# Patient Record
Sex: Male | Born: 1965 | Race: White | Hispanic: No | Marital: Married | Smoking: Never smoker
Health system: Southern US, Community
[De-identification: ages and names within clinical notes are randomized; demographics above are authoritative.]

## PROBLEM LIST (undated history)

## (undated) HISTORY — PX: SHOULDER SURGERY: SHX246

## (undated) HISTORY — PX: LUMBAR SPINE SURGERY: SHX701

---

## 2014-08-06 ENCOUNTER — Encounter (HOSPITAL_COMMUNITY): Payer: Self-pay | Admitting: Orthopedic Surgery

## 2014-08-06 ENCOUNTER — Inpatient Hospital Stay: Admit: 2014-08-06 | Payer: Self-pay | Admitting: Orthopedic Surgery

## 2014-08-06 ENCOUNTER — Inpatient Hospital Stay (HOSPITAL_COMMUNITY)
Admission: AD | Admit: 2014-08-06 | Discharge: 2014-08-06 | DRG: 552 | Discharge status: Against Medical Advice | Payer: 59 | Source: Ambulatory Visit | Attending: Orthopedic Surgery | Admitting: Orthopedic Surgery

## 2014-08-06 ENCOUNTER — Ambulatory Visit (HOSPITAL_COMMUNITY): Payer: Self-pay | Admitting: Orthopedic Surgery

## 2014-08-06 DIAGNOSIS — IMO0002 Reserved for concepts with insufficient information to code with codable children: Secondary | ICD-10-CM | POA: Diagnosis present

## 2014-08-06 DIAGNOSIS — M549 Dorsalgia, unspecified: Secondary | ICD-10-CM | POA: Diagnosis present

## 2014-08-06 MED ORDER — DEXTROSE-NACL 5-0.45 % IV SOLN: INTRAVENOUS | Status: DC

## 2014-08-06 MED ORDER — HYDROCODONE-ACETAMINOPHEN 5-325 MG PO TABS: 1.0000 | ORAL_TABLET | ORAL | Status: DC | PRN

## 2014-08-06 MED ORDER — MORPHINE SULFATE 2 MG/ML IJ SOLN: 2.0000 mg | INTRAMUSCULAR | Status: DC | PRN

## 2014-08-06 NOTE — H&P (Signed)
The patient is a 48 year old male who presents with back and leg pain. The patient is here (08/01/14)in referral from Dr Ethelene Hal for surgical eval. The patient reports low back (left thigh) symptoms including pain, low back pain, numbness, burning and tingling which began 6 week(s) (July 25,th playing [place] state, and at dinner that night pain set in, tightness, ) ago. and Symptoms include pain, muscle spasm, numbness and pain in the calf (left), while symptoms do not include incontinence of stool or incontinence of urine. The pain radiates to the left lower leg. The patient describes the severity of their symptoms as 9 / 10. The patient feels as if the symptoms are worsening. Symptoms are exacerbated by sitting and bending. Symptoms are not relieved by nonsteroidal anti-inflammatory drugs or opioid analgesics. Past evaluation has included x-ray of the lumbar spine (chiropractor) and MRI of the lumbar spine (lumbar @ [facility]). Past treatment has included physical therapy and chiropractic manipulation.  He presents for evaluation of four weeks of severe back, buttock and now progressive left leg pain. He is referred by my partner, Dr. Ethelene Hal, as well as Dr. Silvestre Moment at [facility]  RADIOGRAPHS: His MRI from 08/01/14 shows moderate to marked degenerative disc disease at L4-5 and L3-4 but he has a small disc protrusion at 3-4 but there is no mass effect on the exiting L3 or traversing L4 nerve root. At L4-5 there is a disc fragment extruded into the posterolateral left corner causing compression of the traversing L5 nerve root. There is also significant stenosis secondary to that disc herniation. At L5-S1 there is some mild arthrosis of the facet but no significant central or lateral recess stenosis.   He did have plain x-rays done by his chiropractor but unfortunately I do not have those to review at this point in time.   At this point in time we have had a long discussion of the pathology and the  overall outcomes and the overall treatment options. After reviewing the nonoperative and operative treatment plans he would like to proceed with a microdiscectomy. We reviewed the risks which include infection, bleeding, nerve damage, death, stroke, paralysis, failure to heal, need for further surgery and recurrent disc herniation. All of his questions were encouraged and addressed. We will try and get this done as soon as possible. I have given him some Hydrocodone for some pain relief and to help assist with his sleeping. He will stop using the anti-inflammatories and as soon as we have clearance we will get this done. My hope is that at three months postop we will be able to reintroduce him back to his usual lifestyle just with some modifications to the frequency intensity of certain activities.   Patient contacted me several times since initial evaluation in the office on 08/01/14. As a result of his severe pain and loss in function I elected to admit patient for pain control and surgical management of the disc herniation. After arrival on the floor he stated that he wanted to have the injection and post-pone surgery.  As a result he left AMA.  I will go ahead and set up the injection with Dr Ethelene Hal. Patient was admitted and left before I could actual see him

## 2014-08-06 NOTE — Progress Notes (Signed)
Patient decided that he no longer wished to have surgery and decided to leave AMA. MD office notified and aware. Patient signed AMA paperwork and was discharged to home.

## 2014-08-07 SURGERY — MICRODISCECTOMY LUMBAR LAMINECTOMY
Anesthesia: General | Site: Back | Laterality: Left

## 2014-08-09 NOTE — Discharge Summary (Signed)
Patient signed out AMA shortly after arriving

## 2016-11-09 ENCOUNTER — Other Ambulatory Visit (HOSPITAL_BASED_OUTPATIENT_CLINIC_OR_DEPARTMENT_OTHER): Payer: Self-pay | Admitting: Family Medicine

## 2016-11-09 DIAGNOSIS — R1031 Right lower quadrant pain: Secondary | ICD-10-CM

## 2016-12-20 ENCOUNTER — Ambulatory Visit: Payer: Self-pay | Admitting: Sports Medicine

## 2016-12-21 DIAGNOSIS — R29898 Other symptoms and signs involving the musculoskeletal system: Secondary | ICD-10-CM | POA: Diagnosis not present

## 2016-12-23 DIAGNOSIS — Z86018 Personal history of other benign neoplasm: Secondary | ICD-10-CM | POA: Diagnosis not present

## 2016-12-23 DIAGNOSIS — D1801 Hemangioma of skin and subcutaneous tissue: Secondary | ICD-10-CM | POA: Diagnosis not present

## 2016-12-23 DIAGNOSIS — L814 Other melanin hyperpigmentation: Secondary | ICD-10-CM | POA: Diagnosis not present

## 2016-12-23 DIAGNOSIS — D225 Melanocytic nevi of trunk: Secondary | ICD-10-CM | POA: Diagnosis not present

## 2016-12-28 DIAGNOSIS — Z1211 Encounter for screening for malignant neoplasm of colon: Secondary | ICD-10-CM | POA: Diagnosis not present

## 2016-12-30 DIAGNOSIS — R29898 Other symptoms and signs involving the musculoskeletal system: Secondary | ICD-10-CM | POA: Diagnosis not present

## 2017-01-14 DIAGNOSIS — R29898 Other symptoms and signs involving the musculoskeletal system: Secondary | ICD-10-CM | POA: Diagnosis not present

## 2017-01-28 DIAGNOSIS — R29898 Other symptoms and signs involving the musculoskeletal system: Secondary | ICD-10-CM | POA: Diagnosis not present

## 2017-02-03 DIAGNOSIS — R29898 Other symptoms and signs involving the musculoskeletal system: Secondary | ICD-10-CM | POA: Diagnosis not present

## 2017-03-21 DIAGNOSIS — L03019 Cellulitis of unspecified finger: Secondary | ICD-10-CM | POA: Diagnosis not present

## 2017-05-05 DIAGNOSIS — Z0000 Encounter for general adult medical examination without abnormal findings: Secondary | ICD-10-CM | POA: Diagnosis not present

## 2017-05-05 DIAGNOSIS — Z136 Encounter for screening for cardiovascular disorders: Secondary | ICD-10-CM | POA: Diagnosis not present

## 2017-07-08 DIAGNOSIS — Z125 Encounter for screening for malignant neoplasm of prostate: Secondary | ICD-10-CM | POA: Diagnosis not present

## 2018-02-22 DIAGNOSIS — M5431 Sciatica, right side: Secondary | ICD-10-CM | POA: Diagnosis not present

## 2018-03-07 DIAGNOSIS — Z6825 Body mass index (BMI) 25.0-25.9, adult: Secondary | ICD-10-CM | POA: Diagnosis not present

## 2018-03-07 DIAGNOSIS — M5416 Radiculopathy, lumbar region: Secondary | ICD-10-CM | POA: Diagnosis not present

## 2018-03-07 DIAGNOSIS — R030 Elevated blood-pressure reading, without diagnosis of hypertension: Secondary | ICD-10-CM | POA: Diagnosis not present

## 2018-03-21 DIAGNOSIS — M5116 Intervertebral disc disorders with radiculopathy, lumbar region: Secondary | ICD-10-CM | POA: Diagnosis not present

## 2018-03-21 DIAGNOSIS — M48061 Spinal stenosis, lumbar region without neurogenic claudication: Secondary | ICD-10-CM | POA: Diagnosis not present

## 2018-03-21 DIAGNOSIS — M5416 Radiculopathy, lumbar region: Secondary | ICD-10-CM | POA: Diagnosis not present

## 2018-03-27 DIAGNOSIS — M5416 Radiculopathy, lumbar region: Secondary | ICD-10-CM | POA: Diagnosis not present

## 2018-03-27 DIAGNOSIS — R030 Elevated blood-pressure reading, without diagnosis of hypertension: Secondary | ICD-10-CM | POA: Diagnosis not present

## 2018-03-27 DIAGNOSIS — Z6825 Body mass index (BMI) 25.0-25.9, adult: Secondary | ICD-10-CM | POA: Diagnosis not present

## 2018-04-12 DIAGNOSIS — M5416 Radiculopathy, lumbar region: Secondary | ICD-10-CM | POA: Diagnosis not present

## 2018-06-28 DIAGNOSIS — Z86018 Personal history of other benign neoplasm: Secondary | ICD-10-CM | POA: Diagnosis not present

## 2018-06-28 DIAGNOSIS — D1801 Hemangioma of skin and subcutaneous tissue: Secondary | ICD-10-CM | POA: Diagnosis not present

## 2018-06-28 DIAGNOSIS — L814 Other melanin hyperpigmentation: Secondary | ICD-10-CM | POA: Diagnosis not present

## 2018-06-28 DIAGNOSIS — D225 Melanocytic nevi of trunk: Secondary | ICD-10-CM | POA: Diagnosis not present

## 2018-07-18 DIAGNOSIS — Z0000 Encounter for general adult medical examination without abnormal findings: Secondary | ICD-10-CM | POA: Diagnosis not present

## 2018-12-01 DIAGNOSIS — R946 Abnormal results of thyroid function studies: Secondary | ICD-10-CM | POA: Diagnosis not present

## 2018-12-01 DIAGNOSIS — R197 Diarrhea, unspecified: Secondary | ICD-10-CM | POA: Diagnosis not present

## 2018-12-04 ENCOUNTER — Other Ambulatory Visit (HOSPITAL_BASED_OUTPATIENT_CLINIC_OR_DEPARTMENT_OTHER): Payer: Self-pay | Admitting: Physician Assistant

## 2018-12-04 DIAGNOSIS — R197 Diarrhea, unspecified: Secondary | ICD-10-CM

## 2018-12-07 ENCOUNTER — Ambulatory Visit (HOSPITAL_BASED_OUTPATIENT_CLINIC_OR_DEPARTMENT_OTHER)
Admission: RE | Admit: 2018-12-07 | Discharge: 2018-12-07 | Disposition: A | Discharge status: Home / Self Care | Payer: BLUE CROSS/BLUE SHIELD | Source: Ambulatory Visit | Attending: Physician Assistant | Admitting: Physician Assistant

## 2018-12-07 ENCOUNTER — Encounter (HOSPITAL_BASED_OUTPATIENT_CLINIC_OR_DEPARTMENT_OTHER): Payer: Self-pay | Admitting: Radiology

## 2018-12-07 DIAGNOSIS — R197 Diarrhea, unspecified: Secondary | ICD-10-CM | POA: Diagnosis not present

## 2018-12-07 DIAGNOSIS — N281 Cyst of kidney, acquired: Secondary | ICD-10-CM | POA: Diagnosis not present

## 2019-01-26 DIAGNOSIS — R7989 Other specified abnormal findings of blood chemistry: Secondary | ICD-10-CM | POA: Diagnosis not present

## 2019-01-26 DIAGNOSIS — R197 Diarrhea, unspecified: Secondary | ICD-10-CM | POA: Diagnosis not present

## 2019-01-26 DIAGNOSIS — K769 Liver disease, unspecified: Secondary | ICD-10-CM | POA: Diagnosis not present

## 2019-02-02 DIAGNOSIS — R197 Diarrhea, unspecified: Secondary | ICD-10-CM | POA: Diagnosis not present

## 2019-02-09 DIAGNOSIS — K529 Noninfective gastroenteritis and colitis, unspecified: Secondary | ICD-10-CM | POA: Diagnosis not present

## 2019-02-09 DIAGNOSIS — K52831 Collagenous colitis: Secondary | ICD-10-CM | POA: Diagnosis not present

## 2019-02-09 DIAGNOSIS — D127 Benign neoplasm of rectosigmoid junction: Secondary | ICD-10-CM | POA: Diagnosis not present

## 2019-02-09 DIAGNOSIS — D122 Benign neoplasm of ascending colon: Secondary | ICD-10-CM | POA: Diagnosis not present

## 2019-02-09 DIAGNOSIS — K621 Rectal polyp: Secondary | ICD-10-CM | POA: Diagnosis not present

## 2019-02-13 DIAGNOSIS — K52831 Collagenous colitis: Secondary | ICD-10-CM | POA: Diagnosis not present

## 2019-02-13 DIAGNOSIS — D127 Benign neoplasm of rectosigmoid junction: Secondary | ICD-10-CM | POA: Diagnosis not present

## 2019-02-13 DIAGNOSIS — D122 Benign neoplasm of ascending colon: Secondary | ICD-10-CM | POA: Diagnosis not present

## 2019-07-02 DIAGNOSIS — D225 Melanocytic nevi of trunk: Secondary | ICD-10-CM | POA: Diagnosis not present

## 2019-07-02 DIAGNOSIS — L814 Other melanin hyperpigmentation: Secondary | ICD-10-CM | POA: Diagnosis not present

## 2019-07-02 DIAGNOSIS — Z86018 Personal history of other benign neoplasm: Secondary | ICD-10-CM | POA: Diagnosis not present

## 2019-07-02 DIAGNOSIS — L821 Other seborrheic keratosis: Secondary | ICD-10-CM | POA: Diagnosis not present

## 2019-07-20 DIAGNOSIS — E038 Other specified hypothyroidism: Secondary | ICD-10-CM | POA: Diagnosis not present

## 2019-07-20 DIAGNOSIS — Z0000 Encounter for general adult medical examination without abnormal findings: Secondary | ICD-10-CM | POA: Diagnosis not present

## 2019-07-20 DIAGNOSIS — Z13220 Encounter for screening for lipoid disorders: Secondary | ICD-10-CM | POA: Diagnosis not present

## 2019-07-20 DIAGNOSIS — Z131 Encounter for screening for diabetes mellitus: Secondary | ICD-10-CM | POA: Diagnosis not present

## 2020-02-20 DIAGNOSIS — R2232 Localized swelling, mass and lump, left upper limb: Secondary | ICD-10-CM | POA: Diagnosis not present

## 2020-02-20 DIAGNOSIS — E038 Other specified hypothyroidism: Secondary | ICD-10-CM | POA: Diagnosis not present

## 2020-02-20 DIAGNOSIS — R944 Abnormal results of kidney function studies: Secondary | ICD-10-CM | POA: Diagnosis not present

## 2020-02-20 DIAGNOSIS — Z0183 Encounter for blood typing: Secondary | ICD-10-CM | POA: Diagnosis not present

## 2020-02-21 ENCOUNTER — Other Ambulatory Visit (HOSPITAL_BASED_OUTPATIENT_CLINIC_OR_DEPARTMENT_OTHER): Payer: Self-pay | Admitting: Family Medicine

## 2020-02-21 DIAGNOSIS — R946 Abnormal results of thyroid function studies: Secondary | ICD-10-CM

## 2020-02-22 ENCOUNTER — Other Ambulatory Visit (HOSPITAL_BASED_OUTPATIENT_CLINIC_OR_DEPARTMENT_OTHER): Payer: Self-pay | Admitting: Family Medicine

## 2020-02-22 DIAGNOSIS — R7989 Other specified abnormal findings of blood chemistry: Secondary | ICD-10-CM

## 2020-02-27 ENCOUNTER — Ambulatory Visit (HOSPITAL_BASED_OUTPATIENT_CLINIC_OR_DEPARTMENT_OTHER): Payer: BC Managed Care – PPO

## 2020-03-03 ENCOUNTER — Other Ambulatory Visit: Payer: BC Managed Care – PPO

## 2020-03-04 ENCOUNTER — Ambulatory Visit
Admission: RE | Admit: 2020-03-04 | Discharge: 2020-03-04 | Disposition: A | Discharge status: Home / Self Care | Payer: BC Managed Care – PPO | Source: Ambulatory Visit | Attending: Family Medicine | Admitting: Family Medicine

## 2020-03-04 DIAGNOSIS — R946 Abnormal results of thyroid function studies: Secondary | ICD-10-CM | POA: Diagnosis not present

## 2020-03-04 DIAGNOSIS — R7989 Other specified abnormal findings of blood chemistry: Secondary | ICD-10-CM

## 2020-03-04 DIAGNOSIS — N281 Cyst of kidney, acquired: Secondary | ICD-10-CM | POA: Diagnosis not present

## 2020-03-10 IMAGING — US US ABDOMEN COMPLETE
1 series · 13 of 25 positions shown · non-contrast
Comparison: None.

CLINICAL DATA: Diarrhea

EXAM:
ABDOMEN ULTRASOUND COMPLETE

[Series 1: us abdomen complete · 0.14mm/px · 13 of 119 slices shown]
[im 1/119]
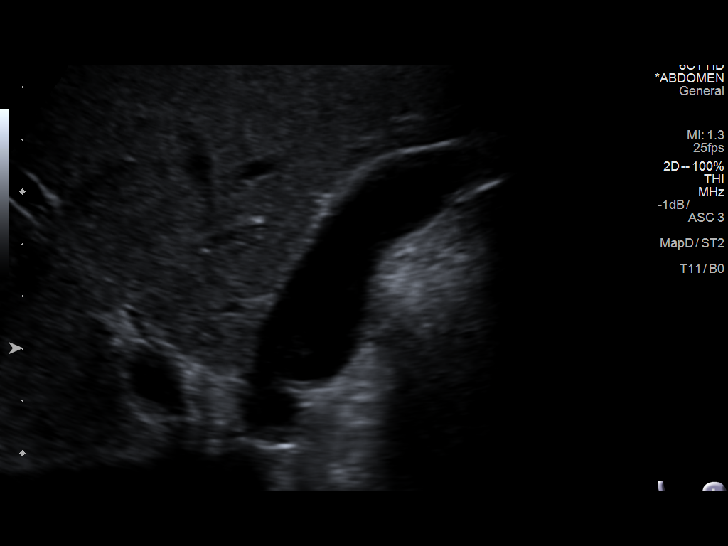
[im 10/119]
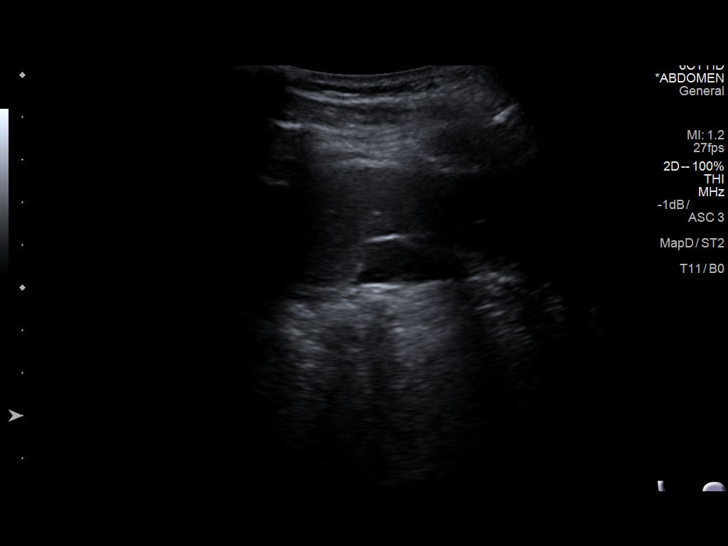
[im 20/119]
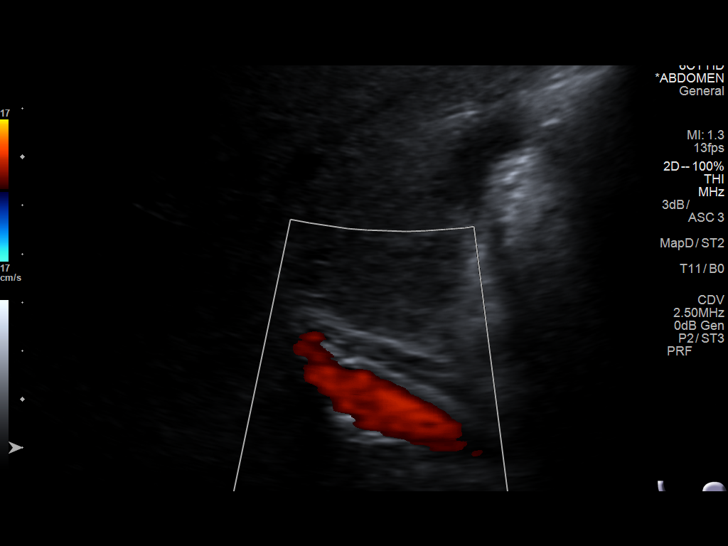
[im 30/119]
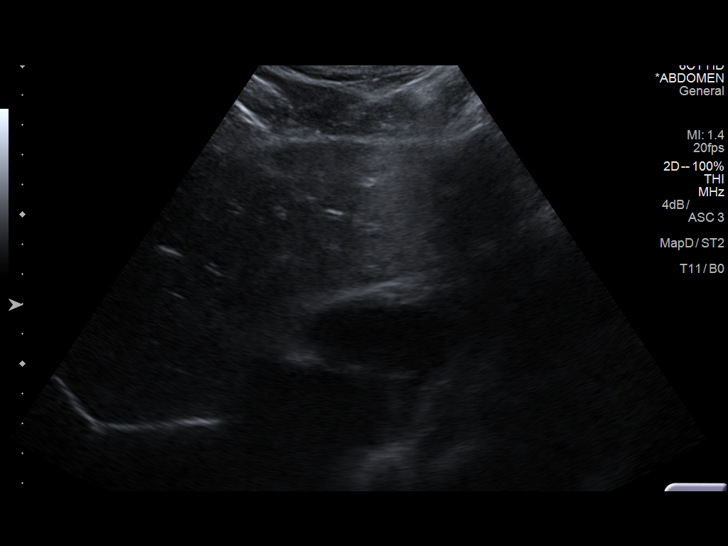
[im 40/119]
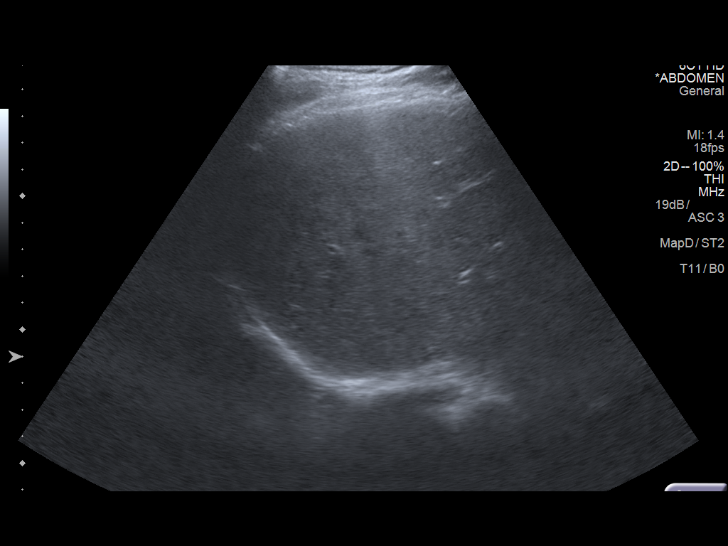
[im 50/119]
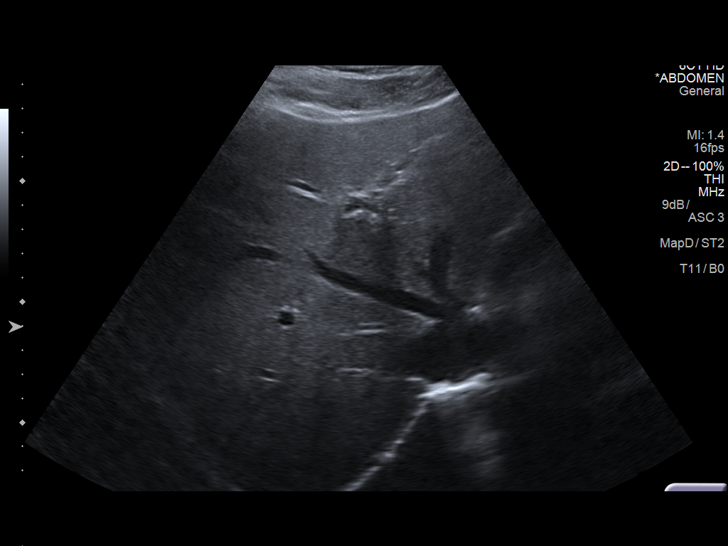
[im 60/119]
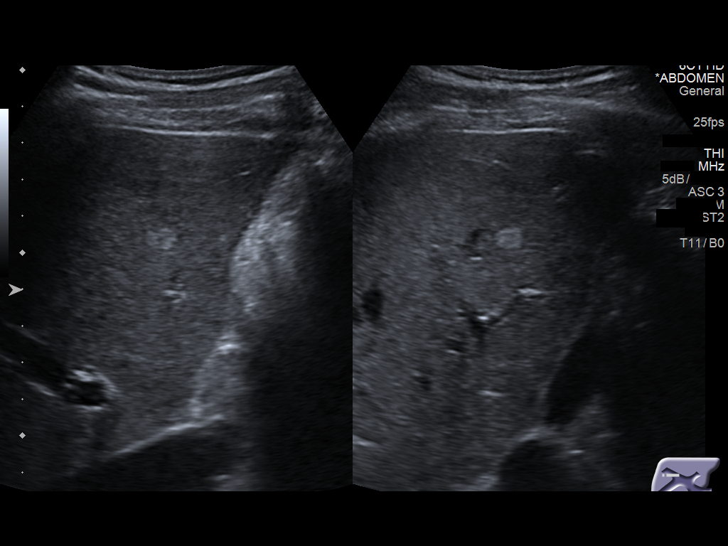
[im 69/119]
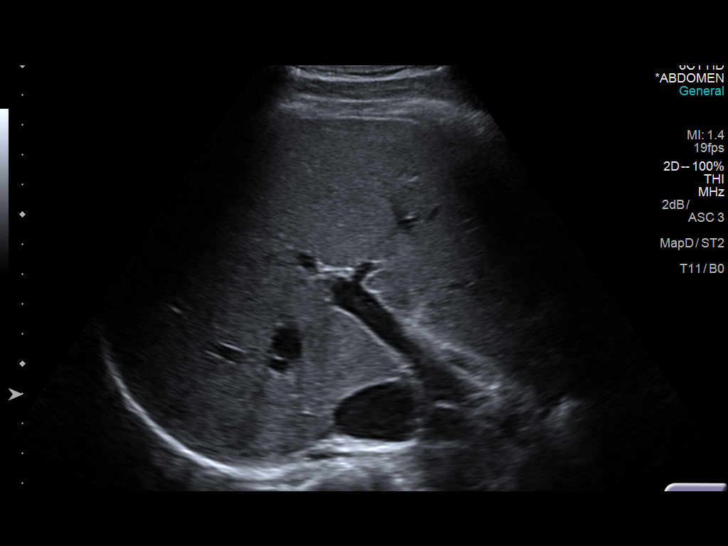
[im 79/119]
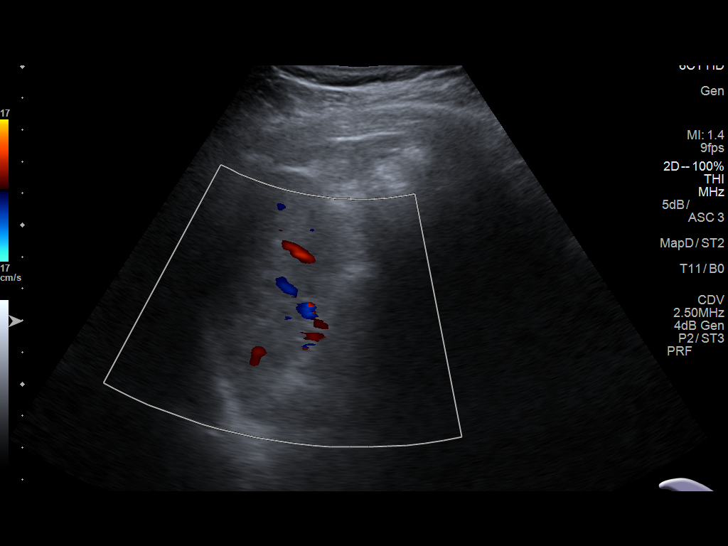
[im 89/119]
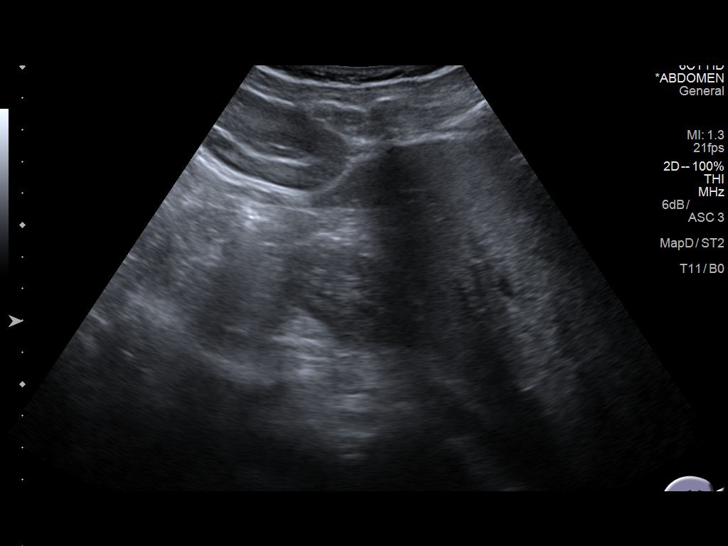
[im 99/119]
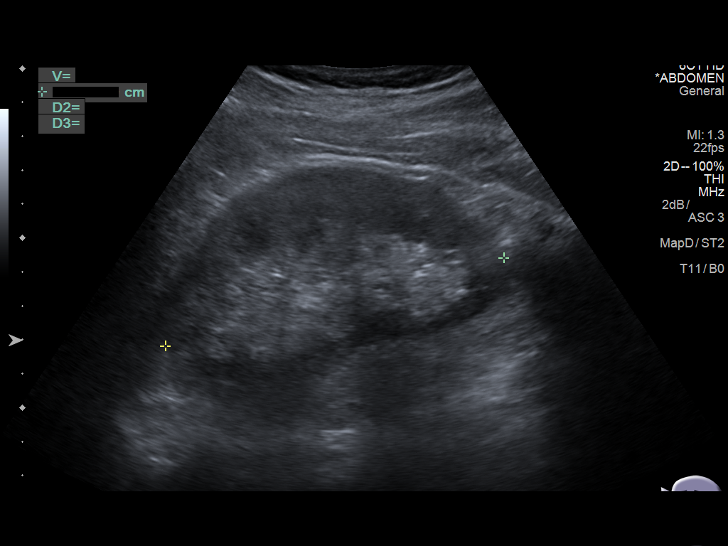
[im 109/119]
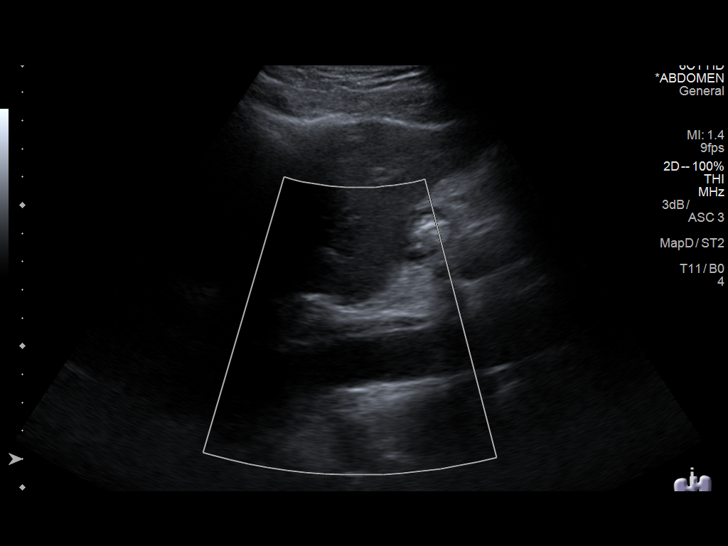
[im 119/119]
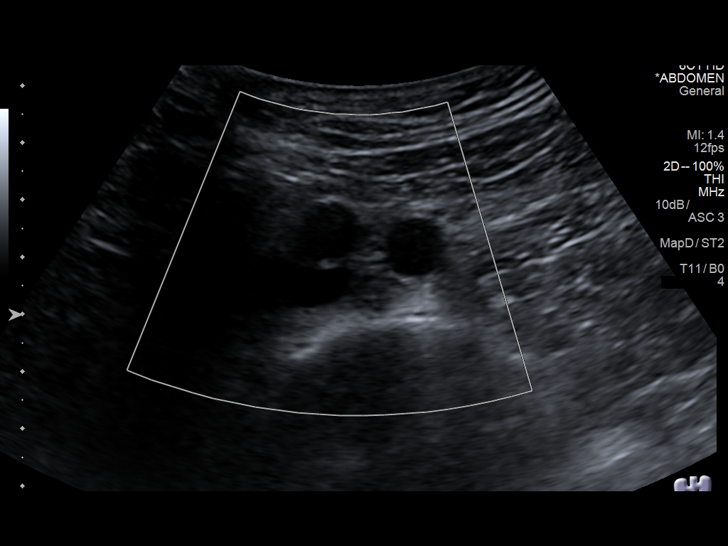

[13 of 25 positions shown; findings below may reference images not displayed]

FINDINGS: Gallbladder: No gallstones or wall thickening visualized. There is
no pericholecystic fluid. No sonographic Murphy sign noted by
sonographer.

Common bile duct: Diameter: 5 mm. No intrahepatic, common hepatic,
or common bile duct dilatation.

Liver: There is a hyperechoic focus in the right lobe of the liver
near the dome measuring 1.1 x 0.9 x 1.1 cm. There is an echogenic
focus somewhat more laterally near the dome on the right measuring
1.4 x 1.3 x 1.3 cm. There is a hyperechoic focus more inferiorly in
the right lobe measuring 1.0 x 0.6 x 0.9 cm.. Within normal limits
in parenchymal echogenicity. Portal vein is patent on color Doppler
imaging with normal direction of blood flow towards the liver.

IVC: No abnormality visualized.

Pancreas: Visualized portion unremarkable. Portions of pancreas
obscured by gas.

Spleen: Size and appearance within normal limits.

Right Kidney: Length: 9.9 cm. Echogenicity within normal limits. No
mass or hydronephrosis visualized.

Left Kidney: Length: 10.3 cm. Echogenicity within normal limits. No
hydronephrosis visualized. There is a cyst arising from the upper
pole left kidney measuring 1.3 x 1.0 x 1.1 cm.

Abdominal aorta: No aneurysm visualized.

Other findings: No demonstrable ascites.
IMPRESSION: 1. Small echogenic foci in the liver, likely small hemangiomas. As
there is no prior studies to compare, a follow-up ultrasound of the
liver in 1 year to confirm stability is advisable.

2.  Small cyst upper pole left kidney.

3. Portions of pancreas obscured by gas. Visualized portions of
pancreas appear unremarkable.

4.  Study within normal limits.

## 2020-04-01 DIAGNOSIS — R944 Abnormal results of kidney function studies: Secondary | ICD-10-CM | POA: Diagnosis not present

## 2020-04-01 DIAGNOSIS — R946 Abnormal results of thyroid function studies: Secondary | ICD-10-CM | POA: Diagnosis not present

## 2020-04-01 DIAGNOSIS — N400 Enlarged prostate without lower urinary tract symptoms: Secondary | ICD-10-CM | POA: Diagnosis not present

## 2020-04-21 DIAGNOSIS — R2232 Localized swelling, mass and lump, left upper limb: Secondary | ICD-10-CM | POA: Diagnosis not present

## 2020-05-06 DIAGNOSIS — S6992XA Unspecified injury of left wrist, hand and finger(s), initial encounter: Secondary | ICD-10-CM | POA: Diagnosis not present

## 2020-05-24 IMAGING — US US RENAL
1 series · 14 of 25 positions shown · non-contrast
Comparison: Prior ultrasound from 12/07/2018.

CLINICAL DATA: Initial evaluation for elevated serum creatinine.

EXAM:
RENAL / URINARY TRACT ULTRASOUND COMPLETE

[Series 1: us renal · 0.23mm/px · 14 of 41 slices shown]
[im 1/41]
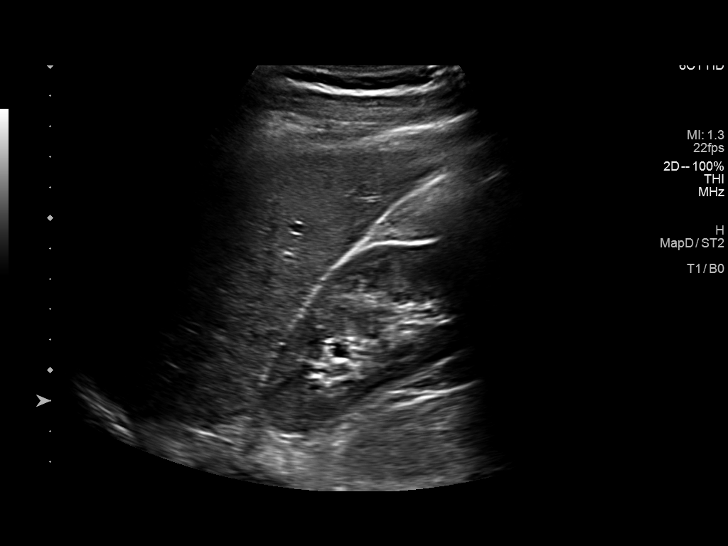
[im 4/41]
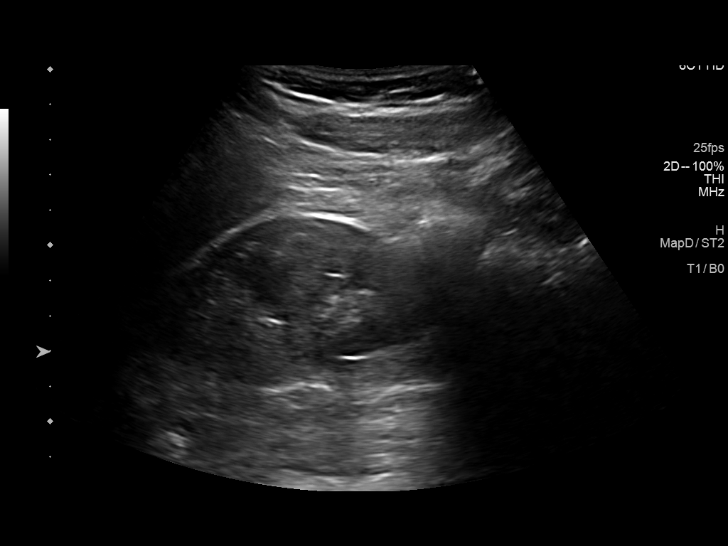
[im 7/41]
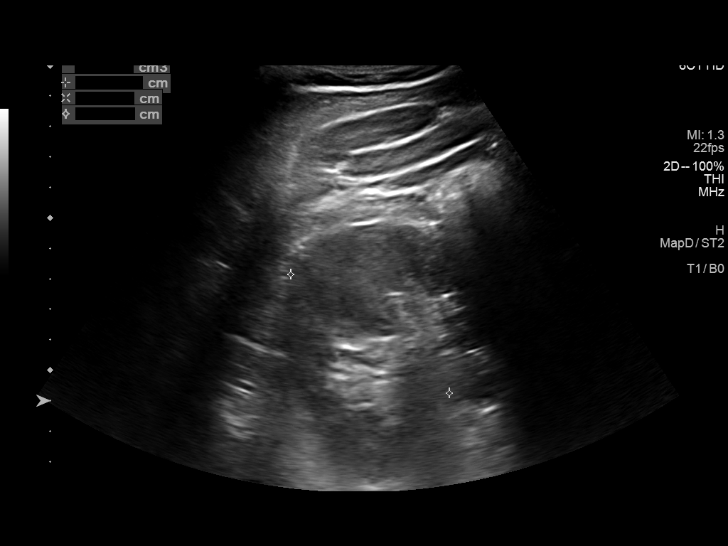
[im 11/41]
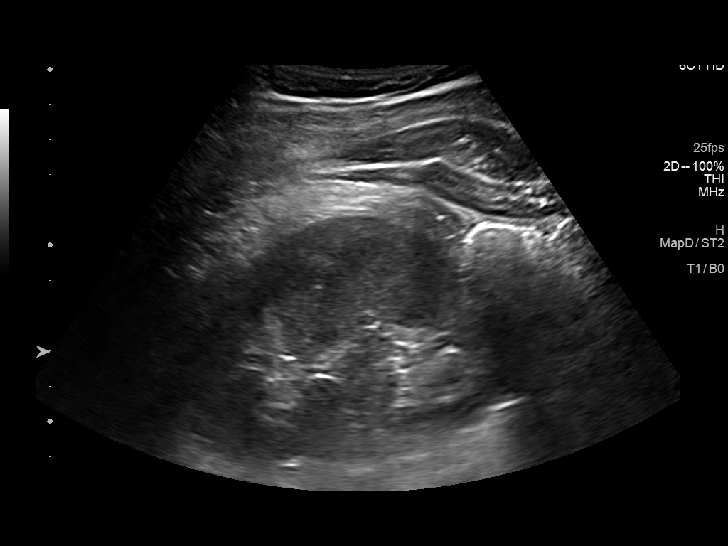
[im 14/41]
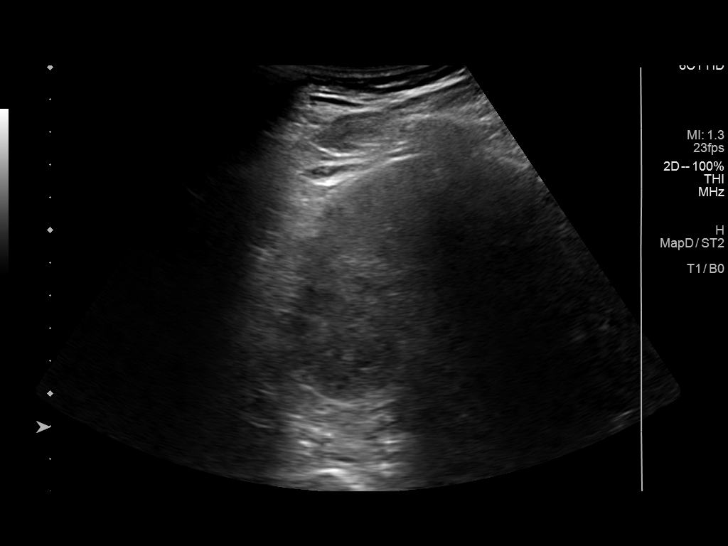
[im 16/41]
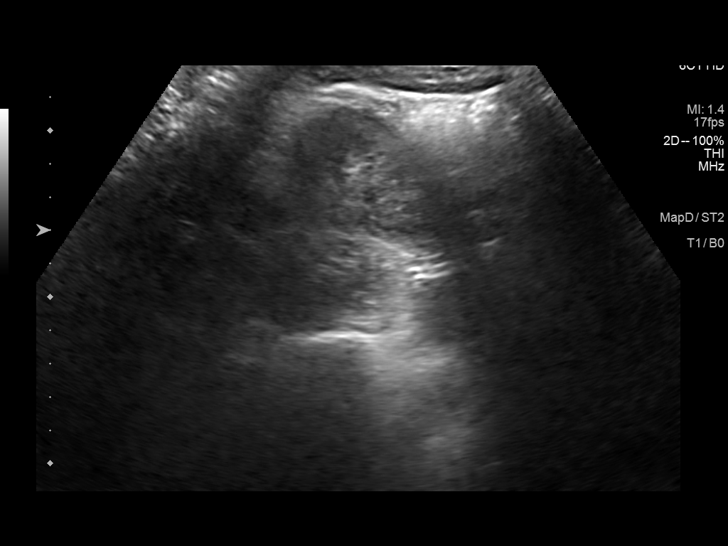
[im 19/41]
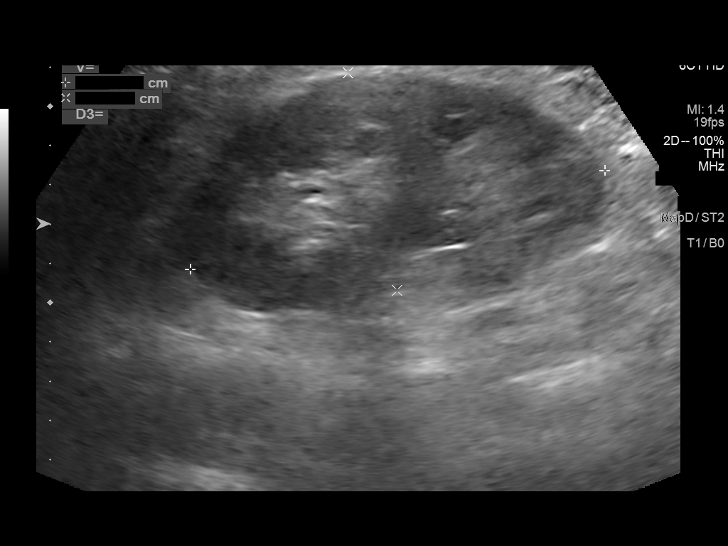
[im 22/41]
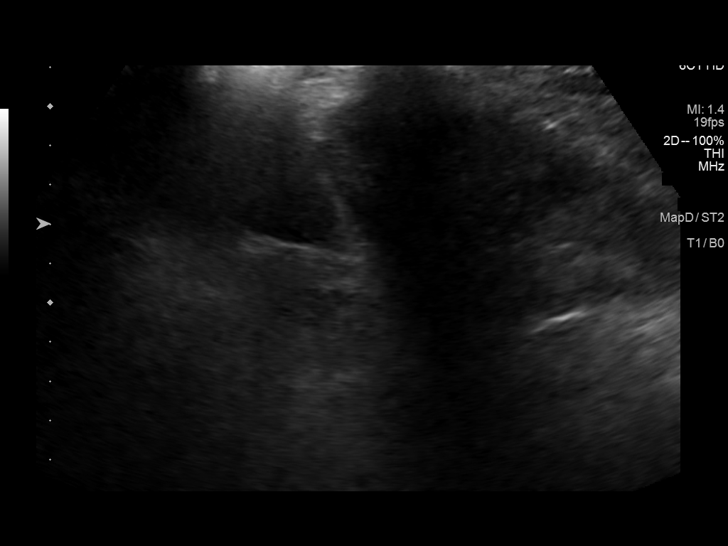
[im 26/41]
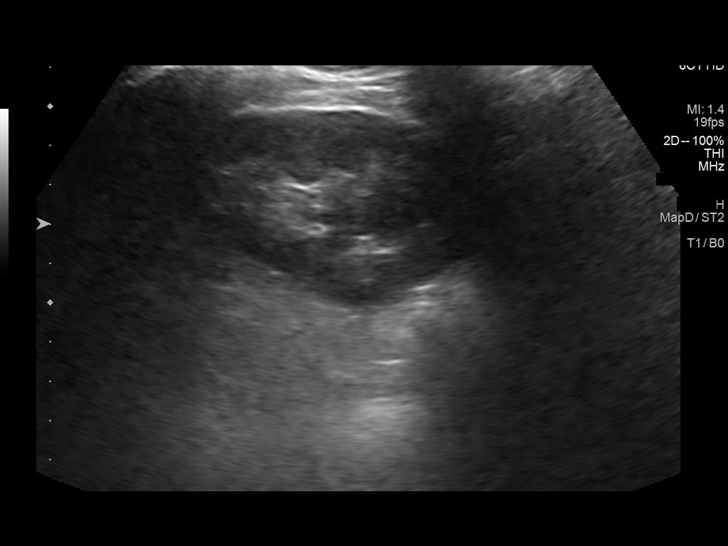
[im 27/41]
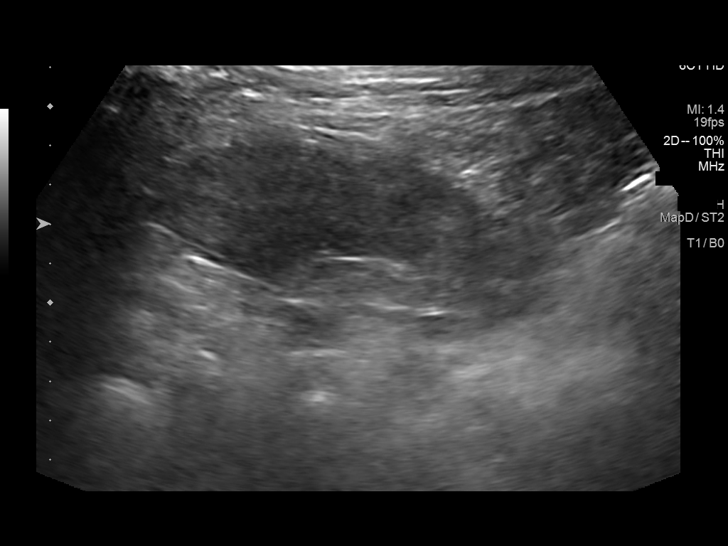
[im 31/41]
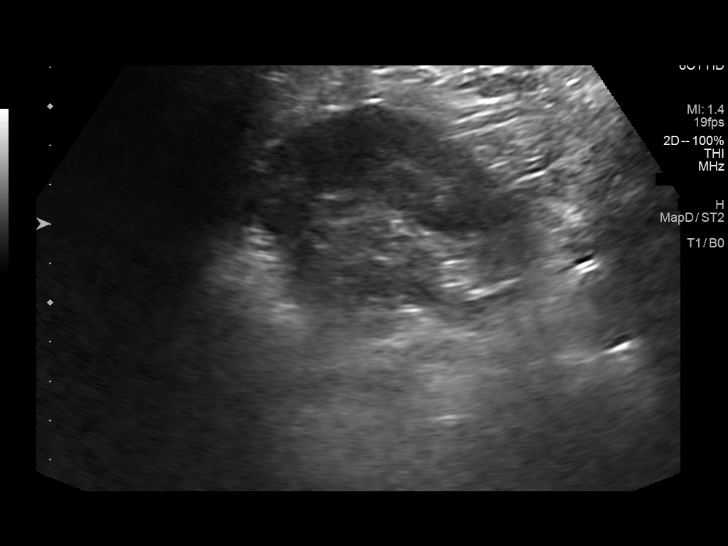
[im 34/41]
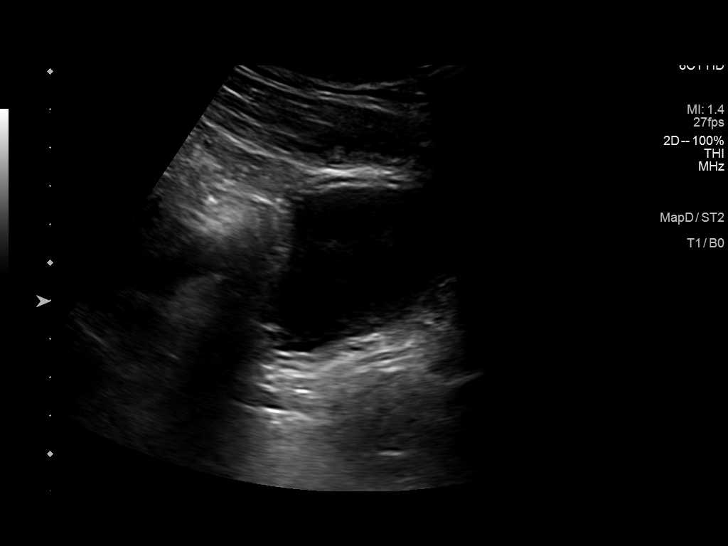
[im 37/41]
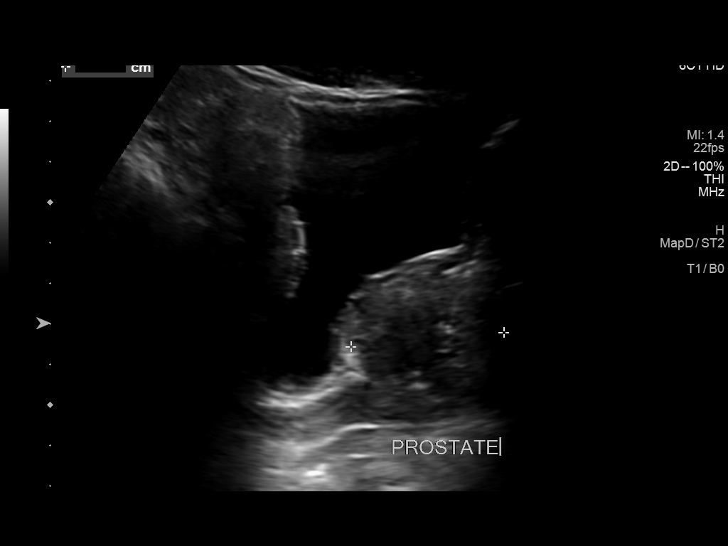
[im 41/41]
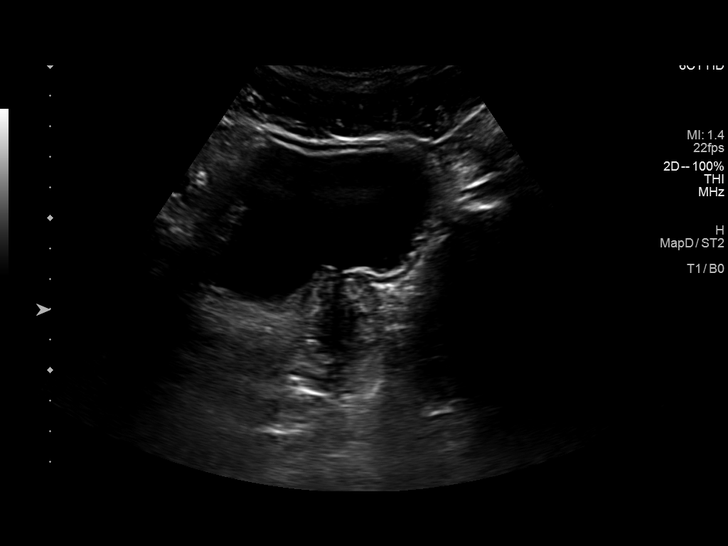

[14 of 25 positions shown; findings below may reference images not displayed]

FINDINGS: Right Kidney:

Renal measurements: 10.1 x 6.7 x 6.5 cm = volume: 229.3 mL.
Echogenicity within normal limits. No nephrolithiasis or
hydronephrosis. No focal renal mass.

Left Kidney:

Renal measurements: 10.9 x 5.7 x 5.6 cm = volume: 181.3 mL.
Echogenicity within normal limits. No nephrolithiasis or
hydronephrosis. 2.3 x 2.1 x 2.1 cm simple exophytic cyst present at
the upper pole.

Bladder:

Appears normal for degree of bladder distention.

Other:

Enlarged prostate measures 3.8 x 3.6 x 2.8 cm, invaginating upon the
base of the bladder.
IMPRESSION: 1. Normal renal echogenicity.  No hydronephrosis.
2. 2.3 cm simple exophytic cyst at the upper pole of the left
kidney.
3. Enlarged prostate invaginating upon the base of the bladder.

## 2020-05-24 IMAGING — US US THYROID
1 series · 14 of 25 positions shown · non-contrast
Comparison: None.

CLINICAL DATA: Other.  Abnormal thyroid function tests.

EXAM:
THYROID ULTRASOUND
TECHNIQUE: Ultrasound examination of the thyroid gland and adjacent soft
tissues was performed.

[Series 1: us thyroid · 0.06mm/px · 14 of 47 slices shown]
[im 1/47]
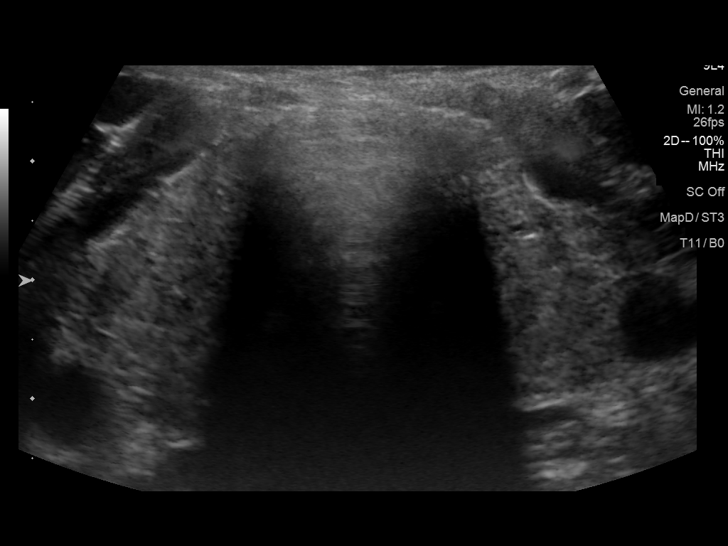
[im 4/47]
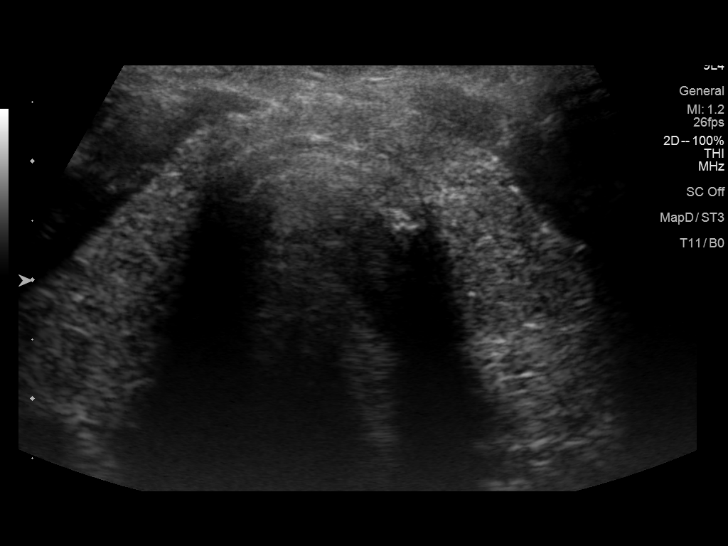
[im 8/47]
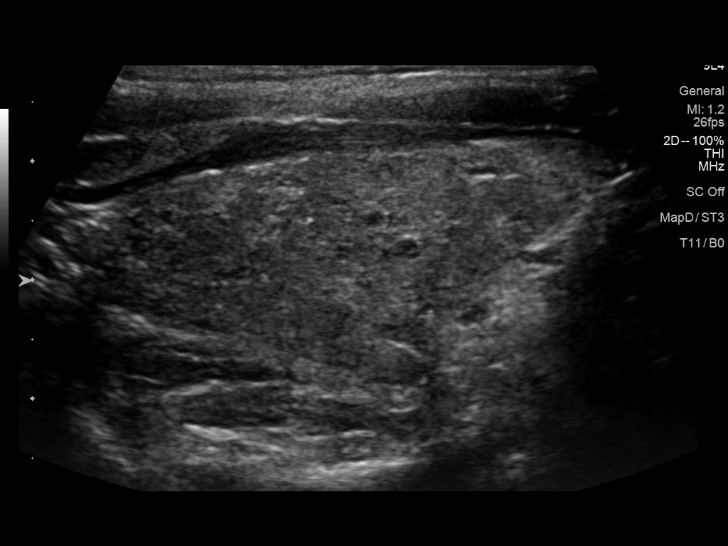
[im 12/47]
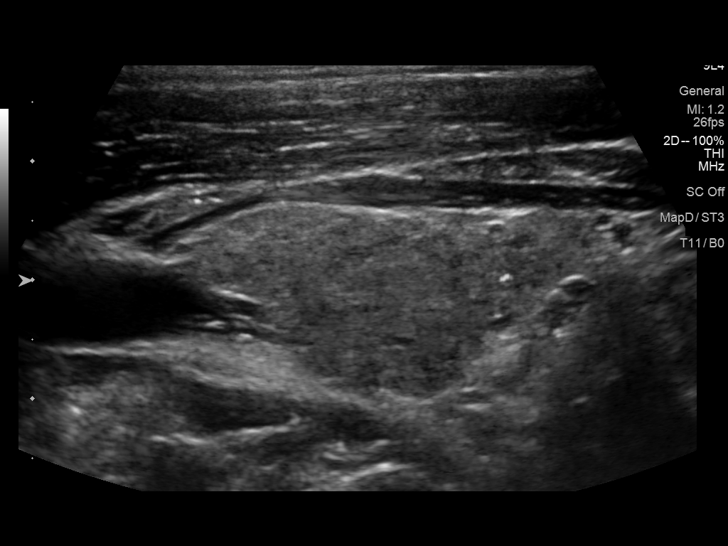
[im 16/47]
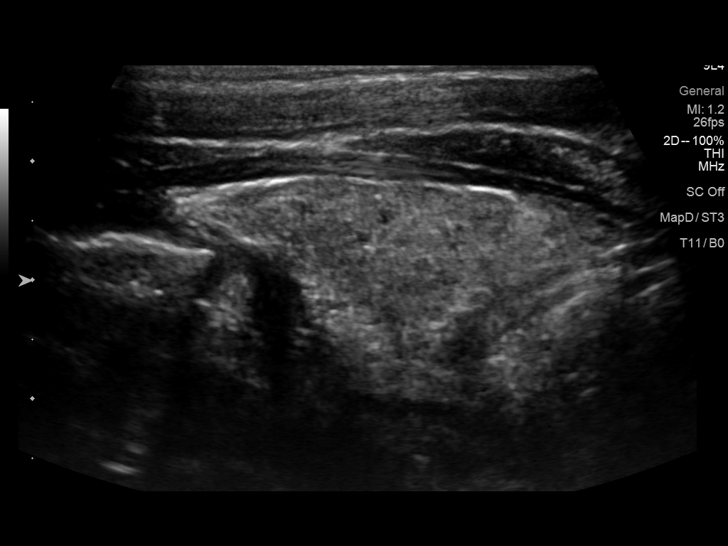
[im 18/47]
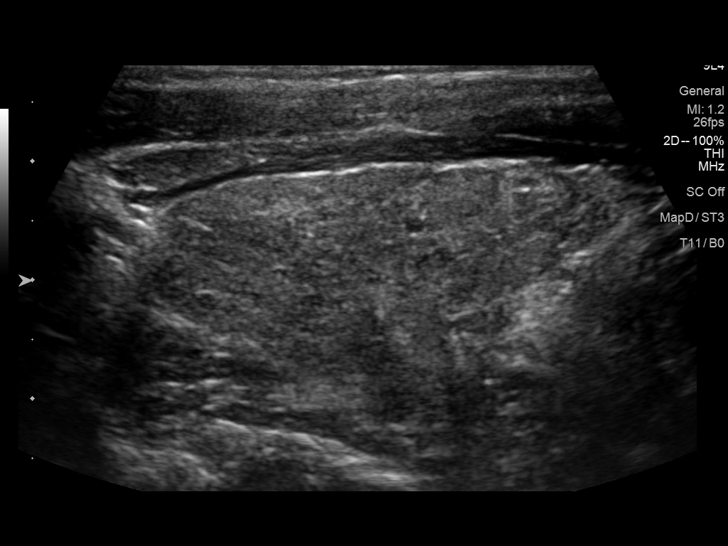
[im 22/47]
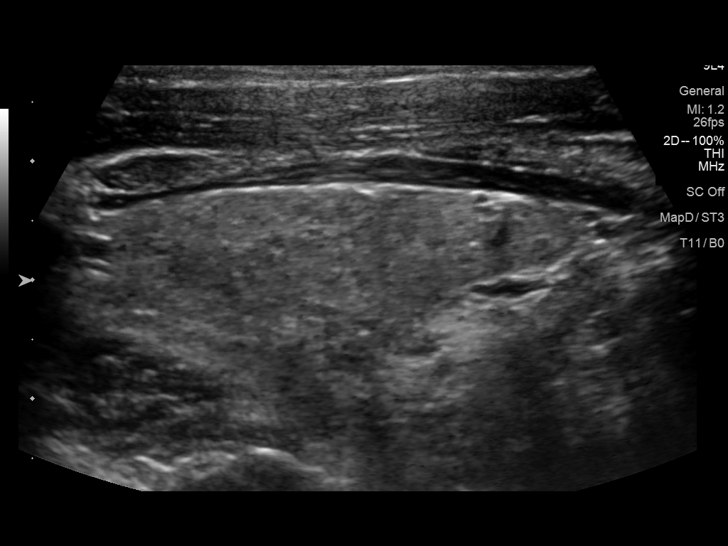
[im 25/47]
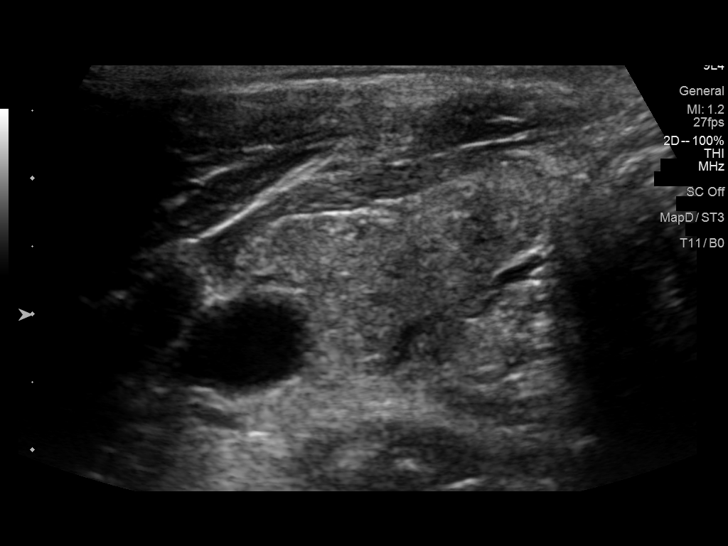
[im 29/47]
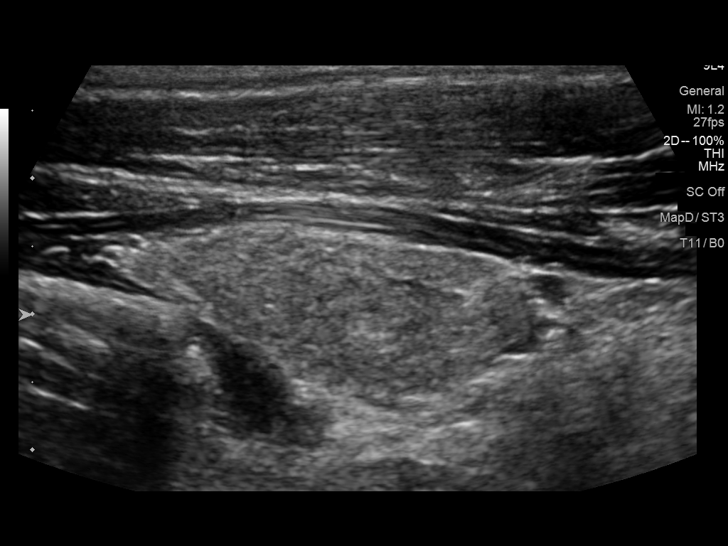
[im 31/47]
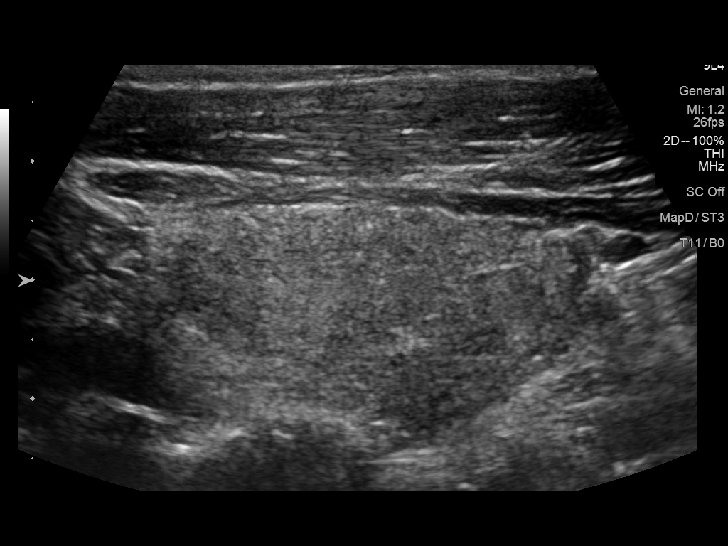
[im 35/47]
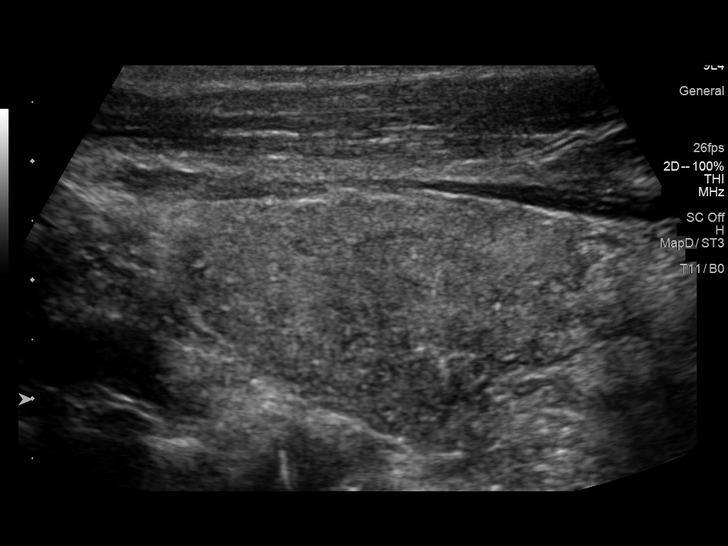
[im 39/47]
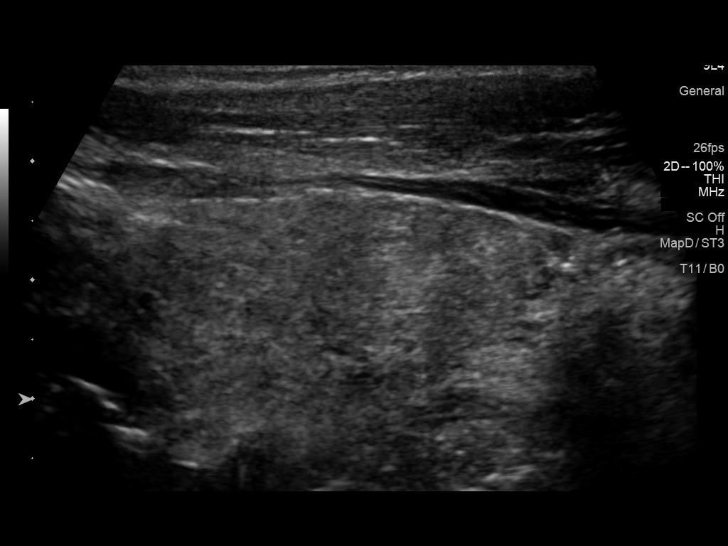
[im 43/47]
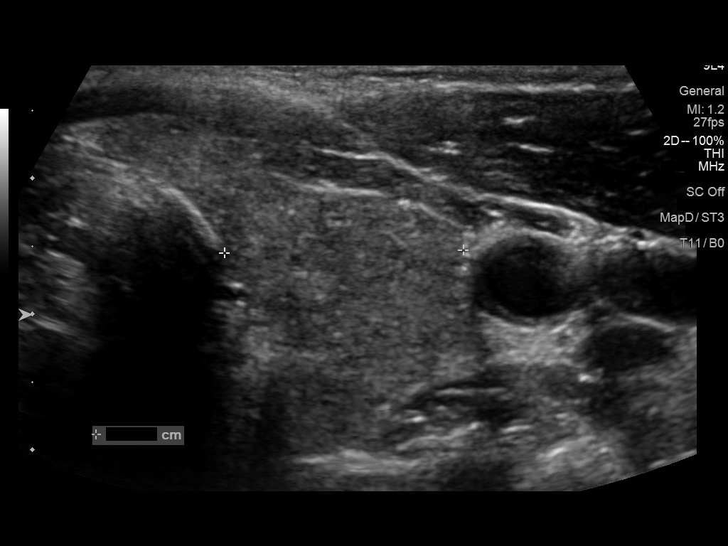
[im 47/47]
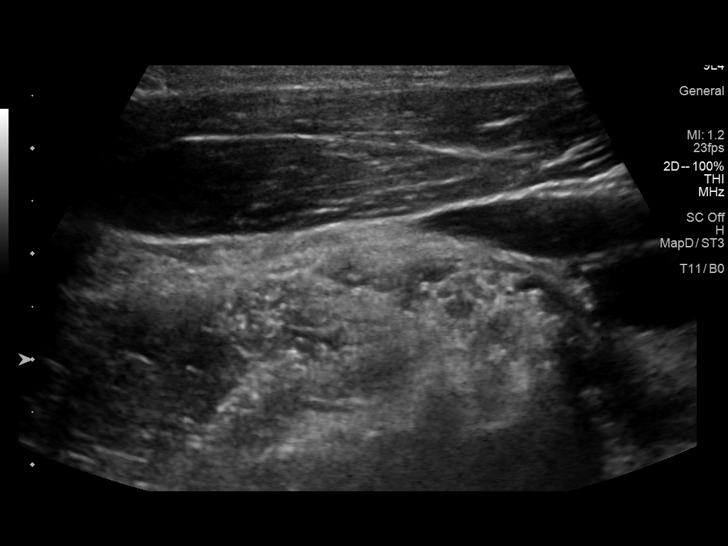

[14 of 25 positions shown; findings below may reference images not displayed]

FINDINGS: Parenchymal Echotexture: Moderately heterogenous - potential diffuse
glandular hyperemia (image 16 and 33)

Isthmus: Normal in size measuring 0.4 cm in diameter

Right lobe: Slightly atrophic measuring 4.6 x 1.9 x 2.4 cm

Left lobe: Slightly atrophic measuring 3.6 x 1.3 x 1.8 cm

_________________________________________________________

Estimated total number of nodules >/= 1 cm: 0

Number of spongiform nodules >/=  2 cm not described below (TR1): 0

Number of mixed cystic and solid nodules >/= 1.5 cm not described
below (TR2): 0

_________________________________________________________

No discrete nodules are seen within the thyroid gland.
IMPRESSION: Moderately heterogeneous, slightly atrophic and potentially
hyperemic thyroid gland without discrete nodule or mass. Findings
are nonspecific though could be seen in the setting of a
thyroiditis. Clinical correlation is advised.

## 2020-07-04 DIAGNOSIS — N1831 Chronic kidney disease, stage 3a: Secondary | ICD-10-CM | POA: Diagnosis not present

## 2020-07-04 DIAGNOSIS — N400 Enlarged prostate without lower urinary tract symptoms: Secondary | ICD-10-CM | POA: Diagnosis not present

## 2020-07-04 DIAGNOSIS — N529 Male erectile dysfunction, unspecified: Secondary | ICD-10-CM | POA: Diagnosis not present

## 2020-07-24 DIAGNOSIS — Z0000 Encounter for general adult medical examination without abnormal findings: Secondary | ICD-10-CM | POA: Diagnosis not present

## 2020-08-20 DIAGNOSIS — L821 Other seborrheic keratosis: Secondary | ICD-10-CM | POA: Diagnosis not present

## 2020-08-20 DIAGNOSIS — L570 Actinic keratosis: Secondary | ICD-10-CM | POA: Diagnosis not present

## 2020-08-20 DIAGNOSIS — L814 Other melanin hyperpigmentation: Secondary | ICD-10-CM | POA: Diagnosis not present

## 2020-08-20 DIAGNOSIS — D225 Melanocytic nevi of trunk: Secondary | ICD-10-CM | POA: Diagnosis not present

## 2020-08-20 DIAGNOSIS — Z86018 Personal history of other benign neoplasm: Secondary | ICD-10-CM | POA: Diagnosis not present

## 2021-07-28 DIAGNOSIS — Z23 Encounter for immunization: Secondary | ICD-10-CM | POA: Diagnosis not present

## 2021-07-28 DIAGNOSIS — E038 Other specified hypothyroidism: Secondary | ICD-10-CM | POA: Diagnosis not present

## 2021-07-28 DIAGNOSIS — Z13220 Encounter for screening for lipoid disorders: Secondary | ICD-10-CM | POA: Diagnosis not present

## 2021-07-28 DIAGNOSIS — Z125 Encounter for screening for malignant neoplasm of prostate: Secondary | ICD-10-CM | POA: Diagnosis not present

## 2021-07-28 DIAGNOSIS — Z0000 Encounter for general adult medical examination without abnormal findings: Secondary | ICD-10-CM | POA: Diagnosis not present

## 2021-07-31 DIAGNOSIS — E038 Other specified hypothyroidism: Secondary | ICD-10-CM | POA: Diagnosis not present

## 2021-07-31 DIAGNOSIS — N182 Chronic kidney disease, stage 2 (mild): Secondary | ICD-10-CM | POA: Diagnosis not present

## 2021-08-26 DIAGNOSIS — D225 Melanocytic nevi of trunk: Secondary | ICD-10-CM | POA: Diagnosis not present

## 2021-08-26 DIAGNOSIS — L821 Other seborrheic keratosis: Secondary | ICD-10-CM | POA: Diagnosis not present

## 2021-08-26 DIAGNOSIS — Z86018 Personal history of other benign neoplasm: Secondary | ICD-10-CM | POA: Diagnosis not present

## 2021-08-26 DIAGNOSIS — L570 Actinic keratosis: Secondary | ICD-10-CM | POA: Diagnosis not present

## 2021-08-26 DIAGNOSIS — L814 Other melanin hyperpigmentation: Secondary | ICD-10-CM | POA: Diagnosis not present

## 2021-11-03 DIAGNOSIS — M5416 Radiculopathy, lumbar region: Secondary | ICD-10-CM | POA: Diagnosis not present

## 2021-11-19 DIAGNOSIS — M5459 Other low back pain: Secondary | ICD-10-CM | POA: Diagnosis not present

## 2021-11-25 DIAGNOSIS — M5459 Other low back pain: Secondary | ICD-10-CM | POA: Diagnosis not present

## 2023-11-08 ENCOUNTER — Other Ambulatory Visit: Payer: Self-pay | Admitting: Family Medicine

## 2023-11-08 DIAGNOSIS — E785 Hyperlipidemia, unspecified: Secondary | ICD-10-CM
